# Patient Record
Sex: Female | Born: 1965 | Race: White | Hispanic: No | Smoking: Never smoker
Health system: Southern US, Community
[De-identification: ages and names within clinical notes are randomized; demographics above are authoritative.]

## PROBLEM LIST (undated history)

## (undated) DIAGNOSIS — I10 Essential (primary) hypertension: Secondary | ICD-10-CM

---

## 2011-03-20 ENCOUNTER — Other Ambulatory Visit: Payer: Self-pay | Admitting: Internal Medicine

## 2011-03-20 DIAGNOSIS — Z1231 Encounter for screening mammogram for malignant neoplasm of breast: Secondary | ICD-10-CM

## 2011-04-10 ENCOUNTER — Ambulatory Visit
Admission: RE | Admit: 2011-04-10 | Discharge: 2011-04-10 | Disposition: A | Discharge status: Home / Self Care | Payer: BC Managed Care – PPO | Source: Ambulatory Visit | Attending: Internal Medicine | Admitting: Internal Medicine

## 2011-04-10 DIAGNOSIS — Z1231 Encounter for screening mammogram for malignant neoplasm of breast: Secondary | ICD-10-CM

## 2013-12-05 ENCOUNTER — Encounter: Payer: Self-pay | Admitting: Obstetrics & Gynecology

## 2014-10-19 ENCOUNTER — Telehealth: Payer: Self-pay | Admitting: *Deleted

## 2014-10-19 NOTE — Telephone Encounter (Signed)
error 

## 2016-02-24 DIAGNOSIS — Z1151 Encounter for screening for human papillomavirus (HPV): Secondary | ICD-10-CM | POA: Diagnosis not present

## 2016-02-24 DIAGNOSIS — N920 Excessive and frequent menstruation with regular cycle: Secondary | ICD-10-CM | POA: Diagnosis not present

## 2016-02-24 DIAGNOSIS — R87611 Atypical squamous cells cannot exclude high grade squamous intraepithelial lesion on cytologic smear of cervix (ASC-H): Secondary | ICD-10-CM | POA: Diagnosis not present

## 2016-02-24 DIAGNOSIS — Z01419 Encounter for gynecological examination (general) (routine) without abnormal findings: Secondary | ICD-10-CM | POA: Diagnosis not present

## 2016-03-16 DIAGNOSIS — D259 Leiomyoma of uterus, unspecified: Secondary | ICD-10-CM | POA: Diagnosis not present

## 2016-03-16 DIAGNOSIS — N920 Excessive and frequent menstruation with regular cycle: Secondary | ICD-10-CM | POA: Diagnosis not present

## 2016-03-27 DIAGNOSIS — Z87891 Personal history of nicotine dependence: Secondary | ICD-10-CM | POA: Diagnosis not present

## 2016-03-27 DIAGNOSIS — R079 Chest pain, unspecified: Secondary | ICD-10-CM | POA: Diagnosis not present

## 2016-03-29 DIAGNOSIS — N920 Excessive and frequent menstruation with regular cycle: Secondary | ICD-10-CM | POA: Diagnosis not present

## 2016-05-09 DIAGNOSIS — R87610 Atypical squamous cells of undetermined significance on cytologic smear of cervix (ASC-US): Secondary | ICD-10-CM | POA: Diagnosis not present

## 2016-05-09 DIAGNOSIS — N920 Excessive and frequent menstruation with regular cycle: Secondary | ICD-10-CM | POA: Diagnosis not present

## 2016-05-09 DIAGNOSIS — Z30430 Encounter for insertion of intrauterine contraceptive device: Secondary | ICD-10-CM | POA: Diagnosis not present

## 2016-06-22 DIAGNOSIS — Z30431 Encounter for routine checking of intrauterine contraceptive device: Secondary | ICD-10-CM | POA: Diagnosis not present

## 2016-09-02 DIAGNOSIS — H66002 Acute suppurative otitis media without spontaneous rupture of ear drum, left ear: Secondary | ICD-10-CM | POA: Diagnosis not present

## 2017-01-31 DIAGNOSIS — I10 Essential (primary) hypertension: Secondary | ICD-10-CM | POA: Diagnosis not present

## 2017-01-31 DIAGNOSIS — D509 Iron deficiency anemia, unspecified: Secondary | ICD-10-CM | POA: Diagnosis not present

## 2017-01-31 DIAGNOSIS — E119 Type 2 diabetes mellitus without complications: Secondary | ICD-10-CM | POA: Diagnosis not present

## 2017-01-31 DIAGNOSIS — E039 Hypothyroidism, unspecified: Secondary | ICD-10-CM | POA: Diagnosis not present

## 2017-02-02 DIAGNOSIS — E039 Hypothyroidism, unspecified: Secondary | ICD-10-CM | POA: Diagnosis not present

## 2017-02-02 DIAGNOSIS — E119 Type 2 diabetes mellitus without complications: Secondary | ICD-10-CM | POA: Diagnosis not present

## 2017-02-02 DIAGNOSIS — I10 Essential (primary) hypertension: Secondary | ICD-10-CM | POA: Diagnosis not present

## 2017-02-02 DIAGNOSIS — E782 Mixed hyperlipidemia: Secondary | ICD-10-CM | POA: Diagnosis not present

## 2018-02-18 DIAGNOSIS — E039 Hypothyroidism, unspecified: Secondary | ICD-10-CM | POA: Diagnosis not present

## 2018-02-18 DIAGNOSIS — E669 Obesity, unspecified: Secondary | ICD-10-CM | POA: Diagnosis not present

## 2018-02-18 DIAGNOSIS — R7301 Impaired fasting glucose: Secondary | ICD-10-CM | POA: Diagnosis not present

## 2018-02-18 DIAGNOSIS — E782 Mixed hyperlipidemia: Secondary | ICD-10-CM | POA: Diagnosis not present

## 2018-02-21 DIAGNOSIS — E782 Mixed hyperlipidemia: Secondary | ICD-10-CM | POA: Diagnosis not present

## 2018-02-21 DIAGNOSIS — E039 Hypothyroidism, unspecified: Secondary | ICD-10-CM | POA: Diagnosis not present

## 2018-02-21 DIAGNOSIS — Z6841 Body Mass Index (BMI) 40.0 and over, adult: Secondary | ICD-10-CM | POA: Diagnosis not present

## 2018-02-21 DIAGNOSIS — Z0000 Encounter for general adult medical examination without abnormal findings: Secondary | ICD-10-CM | POA: Diagnosis not present

## 2018-02-21 DIAGNOSIS — N920 Excessive and frequent menstruation with regular cycle: Secondary | ICD-10-CM | POA: Diagnosis not present

## 2018-03-18 DIAGNOSIS — M62830 Muscle spasm of back: Secondary | ICD-10-CM | POA: Diagnosis not present

## 2018-03-18 DIAGNOSIS — E119 Type 2 diabetes mellitus without complications: Secondary | ICD-10-CM | POA: Diagnosis not present

## 2018-03-18 DIAGNOSIS — D509 Iron deficiency anemia, unspecified: Secondary | ICD-10-CM | POA: Diagnosis not present

## 2018-03-18 DIAGNOSIS — E039 Hypothyroidism, unspecified: Secondary | ICD-10-CM | POA: Diagnosis not present

## 2018-03-18 DIAGNOSIS — I10 Essential (primary) hypertension: Secondary | ICD-10-CM | POA: Diagnosis not present

## 2018-03-18 DIAGNOSIS — N920 Excessive and frequent menstruation with regular cycle: Secondary | ICD-10-CM | POA: Diagnosis not present

## 2018-03-18 DIAGNOSIS — G2581 Restless legs syndrome: Secondary | ICD-10-CM | POA: Diagnosis not present

## 2018-03-18 DIAGNOSIS — E782 Mixed hyperlipidemia: Secondary | ICD-10-CM | POA: Diagnosis not present

## 2018-03-18 DIAGNOSIS — Z0000 Encounter for general adult medical examination without abnormal findings: Secondary | ICD-10-CM | POA: Diagnosis not present

## 2018-03-18 DIAGNOSIS — E6609 Other obesity due to excess calories: Secondary | ICD-10-CM | POA: Diagnosis not present

## 2018-03-21 DIAGNOSIS — E039 Hypothyroidism, unspecified: Secondary | ICD-10-CM | POA: Diagnosis not present

## 2018-07-05 DIAGNOSIS — R69 Illness, unspecified: Secondary | ICD-10-CM | POA: Diagnosis not present

## 2018-07-05 DIAGNOSIS — G4700 Insomnia, unspecified: Secondary | ICD-10-CM | POA: Diagnosis not present

## 2018-07-31 DIAGNOSIS — J029 Acute pharyngitis, unspecified: Secondary | ICD-10-CM | POA: Diagnosis not present

## 2018-07-31 DIAGNOSIS — R69 Illness, unspecified: Secondary | ICD-10-CM | POA: Diagnosis not present

## 2018-09-12 DIAGNOSIS — Z0001 Encounter for general adult medical examination with abnormal findings: Secondary | ICD-10-CM | POA: Diagnosis not present

## 2018-09-19 DIAGNOSIS — E785 Hyperlipidemia, unspecified: Secondary | ICD-10-CM | POA: Diagnosis not present

## 2018-09-19 DIAGNOSIS — E039 Hypothyroidism, unspecified: Secondary | ICD-10-CM | POA: Diagnosis not present

## 2018-09-19 DIAGNOSIS — I10 Essential (primary) hypertension: Secondary | ICD-10-CM | POA: Diagnosis not present

## 2018-09-19 DIAGNOSIS — Z6841 Body Mass Index (BMI) 40.0 and over, adult: Secondary | ICD-10-CM | POA: Diagnosis not present

## 2018-09-19 DIAGNOSIS — Z23 Encounter for immunization: Secondary | ICD-10-CM | POA: Diagnosis not present

## 2019-09-03 ENCOUNTER — Encounter (HOSPITAL_COMMUNITY): Payer: Self-pay

## 2019-09-03 ENCOUNTER — Emergency Department (HOSPITAL_COMMUNITY)
Admission: EM | Admit: 2019-09-03 | Discharge: 2019-09-03 | Disposition: A | Discharge status: Home / Self Care | Payer: 59 | Attending: Emergency Medicine | Admitting: Emergency Medicine

## 2019-09-03 ENCOUNTER — Other Ambulatory Visit: Payer: Self-pay

## 2019-09-03 ENCOUNTER — Emergency Department (HOSPITAL_COMMUNITY): Payer: 59

## 2019-09-03 DIAGNOSIS — S51831A Puncture wound without foreign body of right forearm, initial encounter: Secondary | ICD-10-CM | POA: Diagnosis not present

## 2019-09-03 DIAGNOSIS — Y999 Unspecified external cause status: Secondary | ICD-10-CM | POA: Insufficient documentation

## 2019-09-03 DIAGNOSIS — Y929 Unspecified place or not applicable: Secondary | ICD-10-CM | POA: Diagnosis not present

## 2019-09-03 DIAGNOSIS — Y939 Activity, unspecified: Secondary | ICD-10-CM | POA: Insufficient documentation

## 2019-09-03 DIAGNOSIS — I10 Essential (primary) hypertension: Secondary | ICD-10-CM | POA: Insufficient documentation

## 2019-09-03 DIAGNOSIS — W540XXA Bitten by dog, initial encounter: Secondary | ICD-10-CM | POA: Insufficient documentation

## 2019-09-03 DIAGNOSIS — Z23 Encounter for immunization: Secondary | ICD-10-CM | POA: Insufficient documentation

## 2019-09-03 HISTORY — DX: Essential (primary) hypertension: I10

## 2019-09-03 MED ORDER — AMOXICILLIN-POT CLAVULANATE 875-125 MG PO TABS
1.0000 | ORAL_TABLET | Freq: Once | ORAL | Status: AC
Start: 2019-09-03 — End: 2019-09-03
  Administered 2019-09-03: 08:00:00 1 via ORAL
  Filled 2019-09-03: qty 1

## 2019-09-03 MED ORDER — AMOXICILLIN-POT CLAVULANATE 875-125 MG PO TABS: 1.0000 | ORAL_TABLET | Freq: Two times a day (BID) | ORAL | 0 refills | Status: AC

## 2019-09-03 MED ORDER — BACITRACIN ZINC 500 UNIT/GM EX OINT
1.0000 "application " | TOPICAL_OINTMENT | Freq: Two times a day (BID) | CUTANEOUS | Status: DC
  Administered 2019-09-03: 1 via TOPICAL
  Filled 2019-09-03 (×2): qty 0.9

## 2019-09-03 MED ORDER — TETANUS-DIPHTH-ACELL PERTUSSIS 5-2.5-18.5 LF-MCG/0.5 IM SUSP
0.5000 mL | Freq: Once | INTRAMUSCULAR | Status: AC
  Administered 2019-09-03: 09:00:00 0.5 mL via INTRAMUSCULAR
  Filled 2019-09-03: qty 0.5

## 2019-09-03 MED ORDER — BACITRACIN ZINC 500 UNIT/GM EX OINT: 1.0000 "application " | TOPICAL_OINTMENT | Freq: Two times a day (BID) | CUTANEOUS | 0 refills | Status: AC

## 2019-09-03 NOTE — ED Triage Notes (Signed)
Pt has an elderly hound dog that bit her today. States dog is not up to date on vaccines, but is an inside dog. Pt has 4 puncture wounds to right forearm.

## 2019-09-03 NOTE — Discharge Instructions (Addendum)
1. Medications: Augmentin, Bacitracin, usual home medications 2. Treatment: rest, drink plenty of fluids, keep wounds clean with warm soap and water twice per day.  Apply bacitracin after washing the wounds. 3. Follow Up: Please followup with your primary doctor in 3 days wound check; Please return to the ER for signs or symptoms of infection, worsening pain or other concerns.

## 2019-09-03 NOTE — ED Provider Notes (Addendum)
[facility] EMERGENCY DEPARTMENT Provider Note   CSN: [id number] Arrival date & time: 09/03/19  4174     History Chief Complaint  Patient presents with  . Animal Bite    Jaime Hood is a 54 y.o. female with a hx of HTN, depression presents to the Emergency Department complaining of acute, persistent puncture wounds to the right forearm onset around 6 AM when she was bitten by her dog.  Patient reports the dog is inside only and only goes outside on a leash.  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  She reports she reached down for his collar when he bit her.  She denies numbness, tingling or weakness.  She denies a history of immunocompromise or diabetes.  Patient reports she washed the wounds and then came to the emergency room for evaluation.  Additionally she took Advil for pain control which has helped.  No specific aggravating factors.     The history is provided by the patient and medical records. No language interpreter was used.       Past Medical History:  Diagnosis Date  . Hypertension     There are no problems to display for this patient.   History reviewed. No pertinent surgical history.   OB History   No obstetric history on file.     No family history on file.  Social History   Tobacco Use  . Smoking status: Never Smoker  . Smokeless tobacco: Never Used  Substance Use Topics  . Alcohol use: Not on file  . Drug use: Not on file    Home Medications Prior to Admission medications   Medication Sig Start Date End Date Taking? Authorizing Provider  amoxicillin-clavulanate (AUGMENTIN) 875-125 MG tablet Take 1 tablet by mouth 2 (two) times daily for 10 days. One po bid x 7 days 09/03/19 09/13/19  Ronal Maybury, Jarrett Soho, PA-C  bacitracin ointment Apply 1 application topically 2 (two) times daily. 09/03/19   Deondra Wigger, Jarrett Soho, PA-C    Allergies    Patient has no allergy information on record.  Review of Systems   Review of Systems    Constitutional: Negative for fever.  Gastrointestinal: Negative for nausea and vomiting.  Skin: Positive for wound.  Allergic/Immunologic: Negative for immunocompromised state.  Neurological: Negative for weakness and numbness.  Hematological: Does not bruise/bleed easily.  Psychiatric/Behavioral: The patient is not nervous/anxious.     Physical Exam Updated Vital Signs BP (!) 160/112   Pulse (!) 114   Resp 12   Ht 5' 1.5" (1.562 m)   Wt 106.1 kg   SpO2 100%   BMI 43.50 kg/m   Physical Exam Vitals and nursing note reviewed.  Constitutional:      General: She is not in acute distress.    Appearance: She is well-developed.  HENT:     Head: Normocephalic.  Eyes:     General: No scleral icterus.    Conjunctiva/sclera: Conjunctivae normal.  Cardiovascular:     Rate and Rhythm: Normal rate.     Comments: Capillary refill less than 3 seconds in all fingers of the right hand Pulmonary:     Effort: Pulmonary effort is normal.  Musculoskeletal:        General: Normal range of motion.     Cervical back: Normal range of motion.     Comments: Full range of motion of the right elbow wrist and all fingers of the right hand  Skin:    General: Skin is warm and dry.  Findings: Bruising, ecchymosis, signs of injury and laceration present.     Comments: 4 puncture wounds noted to the right forearm, persistent oozing from the largest.  Bruising and ecchymosis surrounding the bite sites.  Neurological:     Mental Status: She is alert.     Comments: Sensation intact to the right upper extremity. Strength 5/5 in the right upper extremity     ED Results / Procedures / Treatments    Radiology DG Forearm Right  Result Date: 09/03/2019 CLINICAL DATA:  Dog bite EXAM: RIGHT FOREARM - 2 VIEW COMPARISON:  None. FINDINGS: There is soft tissue swelling and foci of air along the radial and dorsal aspect of the forearm. Alignment is anatomic. There is no acute fracture. Joint spaces are  preserved. No radiopaque foreign body. IMPRESSION: No acute osseous abnormality.  No radiopaque foreign body. Electronically Signed   By: Guadlupe Spanish M.D.   On: 09/03/2019 08:17    Procedures Irrigation  Date/Time: 09/03/2019 8:26 AM Performed by: Dierdre Forth, PA-C Authorized by: Dierdre Forth, PA-C  Consent: Verbal consent obtained. Risks and benefits: risks, benefits and alternatives were discussed Consent given by: patient Patient understanding: patient states understanding of the procedure being performed Patient consent: the patient's understanding of the procedure matches consent given Procedure consent: procedure consent matches procedure scheduled Relevant documents: relevant documents present and verified Test results: test results available and properly labeled Site marked: the operative site was marked Imaging studies: imaging studies available Required items: required blood products, implants, devices, and special equipment available Patient identity confirmed: verbally with patient Time out: Immediately prior to procedure a "time out" was called to verify the correct patient, procedure, equipment, support staff and site/side marked as required. Preparation: Patient was prepped and draped in the usual sterile fashion. Local anesthesia used: no  Anesthesia: Local anesthesia used: no  Sedation: Patient sedated: no  Patient tolerance: patient tolerated the procedure well with no immediate complications Comments: Individual bites washed with iodine scrub, pressure syringe and saline.  Wound explored without foreign body. Bacitracin and bandage applied.     (including critical care time)  Medications Ordered in ED Medications  bacitracin ointment 1 application (1 application Topical Given 09/03/19 0835)  amoxicillin-clavulanate (AUGMENTIN) 875-125 MG per tablet 1 tablet (1 tablet Oral Given 09/03/19 0756)  Tdap (BOOSTRIX) injection 0.5 mL (0.5 mLs  Intramuscular Given 09/03/19 2130)    ED Course  I have reviewed the triage vital signs and the nursing notes.  Pertinent labs & imaging results that were available during my care of the patient were reviewed by me and considered in my medical decision making (see chart for details).  Clinical Course as of Sep 02 842  Wed Sep 03, 2019  0800 Tachycardia at triage.  No tachycardia on my clinical exam.  Pulse Rate(!): 114 [HM]  0843 Heart rate and blood pressure improved  Pulse Rate: 85 [HM]    Clinical Course User Index [HM] Thorne Wirz, Boyd Kerbs   MDM Rules/Calculators/A&P                      Patient presents with several puncture wounds from a dog bite.  Pt wounds irrigated well with pressure syringe with sterile saline.  Wounds examined with visualization of the base and no foreign bodies seen.  Pt Alert and oriented, NAD, nontoxic, nonseptic appearing.  Capillary refill intact and pt without neurologic deficit.  Right forearm x-rays without fracture or foreign body. Patient tetanus updated.  Patient rabies  vaccine and immunoglobulin risk and benefit discussed.  Patient has no concern about potential rabies exposure given that her dog lives inside with her.  She does not wish for rabies treatment today.  I think this is reasonable. Wounds not closed secondary to concern for infection. Will d/c home with Augmentin, bacitracin and wound care instructions.  Requests for close follow-up with PCP or back in the ER for symptoms of infection.     Final Clinical Impression(s) / ED Diagnoses Final diagnoses:  Dog bite, initial encounter    Rx / DC Orders ED Discharge Orders         Ordered    bacitracin ointment  2 times daily     09/03/19 0834    amoxicillin-clavulanate (AUGMENTIN) 875-125 MG tablet  2 times daily     09/03/19 0834             Irven Ingalsbe, Dahlia Client, PA-C 09/03/19 4403    Bethann Berkshire, MD 09/05/19 1039

## 2020-01-22 DIAGNOSIS — E039 Hypothyroidism, unspecified: Secondary | ICD-10-CM | POA: Diagnosis not present

## 2020-01-22 DIAGNOSIS — G2581 Restless legs syndrome: Secondary | ICD-10-CM | POA: Diagnosis not present

## 2020-01-22 DIAGNOSIS — R69 Illness, unspecified: Secondary | ICD-10-CM | POA: Diagnosis not present

## 2020-01-22 DIAGNOSIS — Z79899 Other long term (current) drug therapy: Secondary | ICD-10-CM | POA: Diagnosis not present

## 2020-01-22 DIAGNOSIS — I10 Essential (primary) hypertension: Secondary | ICD-10-CM | POA: Diagnosis not present

## 2020-01-27 DIAGNOSIS — E785 Hyperlipidemia, unspecified: Secondary | ICD-10-CM | POA: Diagnosis not present

## 2020-01-27 DIAGNOSIS — E039 Hypothyroidism, unspecified: Secondary | ICD-10-CM | POA: Diagnosis not present

## 2020-01-27 DIAGNOSIS — Z6841 Body Mass Index (BMI) 40.0 and over, adult: Secondary | ICD-10-CM | POA: Diagnosis not present

## 2020-01-27 DIAGNOSIS — I10 Essential (primary) hypertension: Secondary | ICD-10-CM | POA: Diagnosis not present

## 2020-06-01 DIAGNOSIS — Z23 Encounter for immunization: Secondary | ICD-10-CM | POA: Diagnosis not present

## 2020-06-01 DIAGNOSIS — I10 Essential (primary) hypertension: Secondary | ICD-10-CM | POA: Diagnosis not present

## 2020-06-05 IMAGING — DX DG FOREARM 2V*R*
2 series · 2 of 2 positions shown · non-contrast
Comparison: None.

CLINICAL DATA: Dog bite

EXAM:
RIGHT FOREARM - 2 VIEW

[forearm ap]
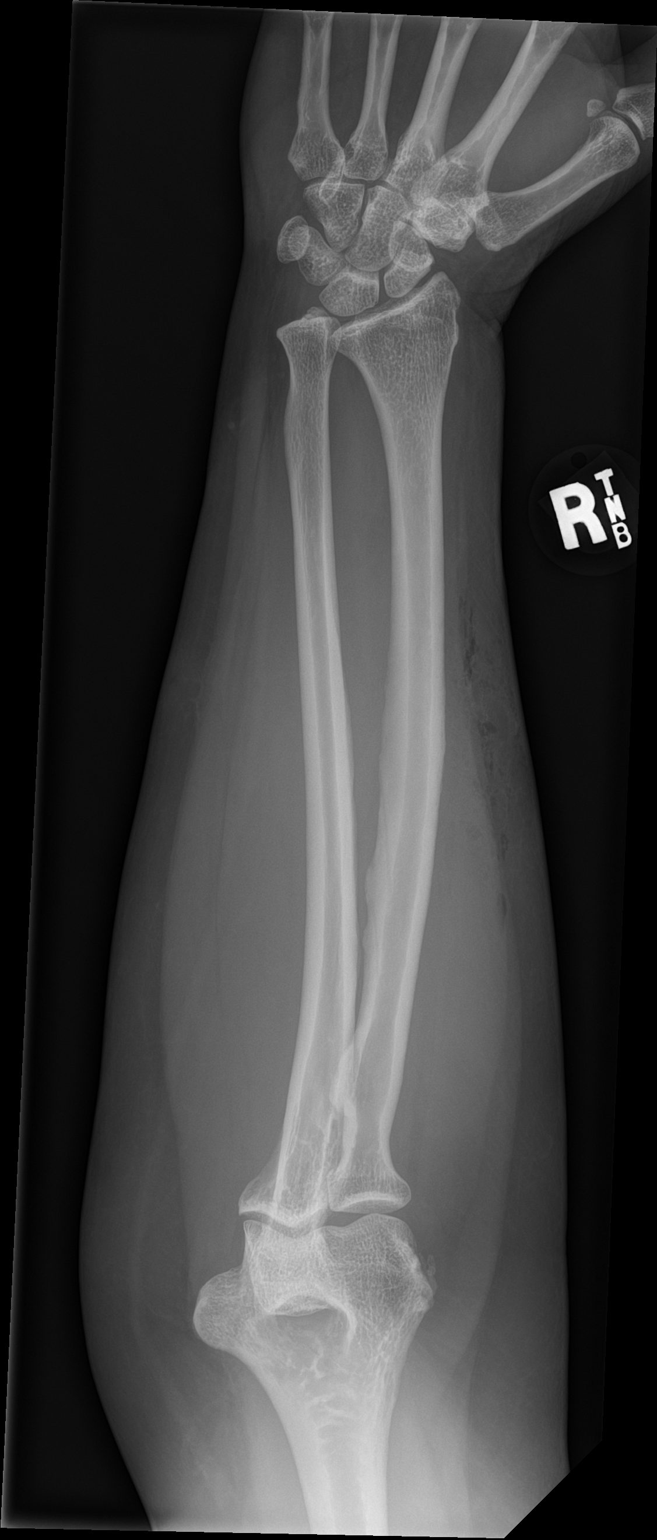

[forearm lat]
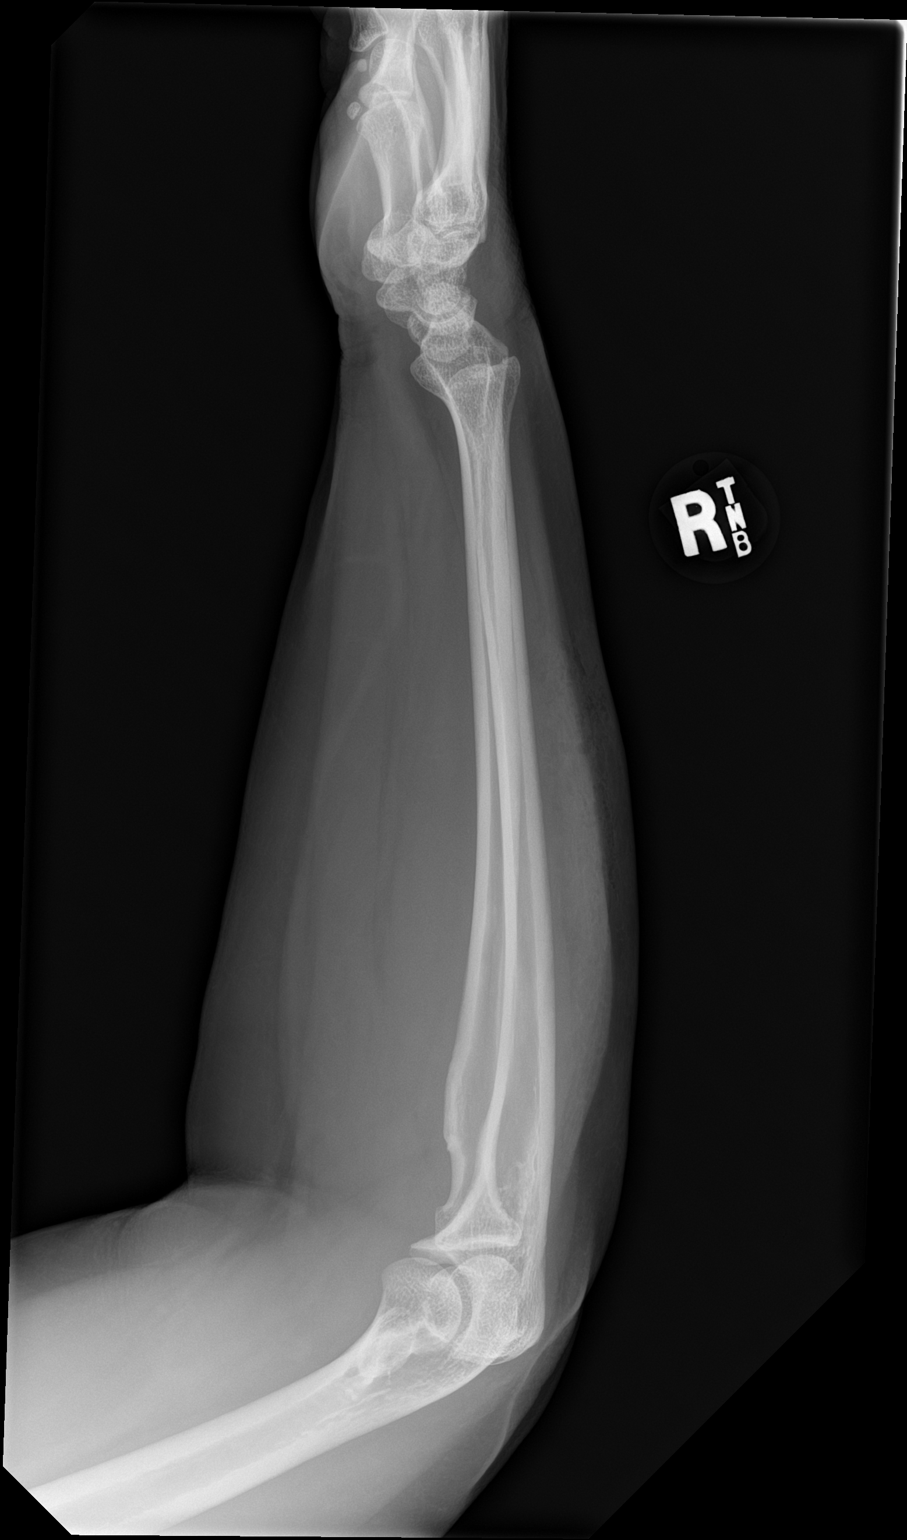

[2 of 2 positions shown; findings below may reference images not displayed]

FINDINGS: There is soft tissue swelling and foci of air along the radial and
dorsal aspect of the forearm. Alignment is anatomic. There is no
acute fracture. Joint spaces are preserved. No radiopaque foreign
body.
IMPRESSION: No acute osseous abnormality.  No radiopaque foreign body.

## 2020-09-14 DIAGNOSIS — I10 Essential (primary) hypertension: Secondary | ICD-10-CM | POA: Diagnosis not present

## 2020-09-14 DIAGNOSIS — R69 Illness, unspecified: Secondary | ICD-10-CM | POA: Diagnosis not present

## 2022-05-19 DIAGNOSIS — S61431A Puncture wound without foreign body of right hand, initial encounter: Secondary | ICD-10-CM | POA: Diagnosis not present

## 2022-09-04 DIAGNOSIS — E039 Hypothyroidism, unspecified: Secondary | ICD-10-CM | POA: Diagnosis not present

## 2022-09-04 DIAGNOSIS — I10 Essential (primary) hypertension: Secondary | ICD-10-CM | POA: Diagnosis not present

## 2022-09-04 DIAGNOSIS — Z79899 Other long term (current) drug therapy: Secondary | ICD-10-CM | POA: Diagnosis not present

## 2022-09-04 DIAGNOSIS — E785 Hyperlipidemia, unspecified: Secondary | ICD-10-CM | POA: Diagnosis not present

## 2022-09-04 DIAGNOSIS — F4381 Prolonged grief disorder: Secondary | ICD-10-CM | POA: Diagnosis not present

## 2022-10-31 DIAGNOSIS — I10 Essential (primary) hypertension: Secondary | ICD-10-CM | POA: Diagnosis not present

## 2022-10-31 DIAGNOSIS — F325 Major depressive disorder, single episode, in full remission: Secondary | ICD-10-CM | POA: Diagnosis not present

## 2022-10-31 DIAGNOSIS — E039 Hypothyroidism, unspecified: Secondary | ICD-10-CM | POA: Diagnosis not present

## 2022-12-21 DIAGNOSIS — N132 Hydronephrosis with renal and ureteral calculous obstruction: Secondary | ICD-10-CM | POA: Diagnosis not present

## 2022-12-21 DIAGNOSIS — N200 Calculus of kidney: Secondary | ICD-10-CM | POA: Diagnosis not present

## 2022-12-21 DIAGNOSIS — N1330 Unspecified hydronephrosis: Secondary | ICD-10-CM | POA: Diagnosis not present

## 2022-12-21 DIAGNOSIS — R109 Unspecified abdominal pain: Secondary | ICD-10-CM | POA: Diagnosis not present

## 2022-12-21 DIAGNOSIS — N134 Hydroureter: Secondary | ICD-10-CM | POA: Diagnosis not present

## 2022-12-21 DIAGNOSIS — R112 Nausea with vomiting, unspecified: Secondary | ICD-10-CM | POA: Diagnosis not present

## 2023-06-23 DIAGNOSIS — Z6841 Body Mass Index (BMI) 40.0 and over, adult: Secondary | ICD-10-CM | POA: Diagnosis not present

## 2023-06-23 DIAGNOSIS — H6091 Unspecified otitis externa, right ear: Secondary | ICD-10-CM | POA: Diagnosis not present

## 2023-06-23 DIAGNOSIS — H6501 Acute serous otitis media, right ear: Secondary | ICD-10-CM | POA: Diagnosis not present

## 2023-06-28 DIAGNOSIS — Z6841 Body Mass Index (BMI) 40.0 and over, adult: Secondary | ICD-10-CM | POA: Diagnosis not present

## 2023-06-28 DIAGNOSIS — R030 Elevated blood-pressure reading, without diagnosis of hypertension: Secondary | ICD-10-CM | POA: Diagnosis not present

## 2023-06-28 DIAGNOSIS — W540XXA Bitten by dog, initial encounter: Secondary | ICD-10-CM | POA: Diagnosis not present

## 2023-06-28 DIAGNOSIS — S81851A Open bite, right lower leg, initial encounter: Secondary | ICD-10-CM | POA: Diagnosis not present

## 2023-07-08 DIAGNOSIS — S81851D Open bite, right lower leg, subsequent encounter: Secondary | ICD-10-CM | POA: Diagnosis not present

## 2023-07-08 DIAGNOSIS — R030 Elevated blood-pressure reading, without diagnosis of hypertension: Secondary | ICD-10-CM | POA: Diagnosis not present

## 2023-07-08 DIAGNOSIS — Z6841 Body Mass Index (BMI) 40.0 and over, adult: Secondary | ICD-10-CM | POA: Diagnosis not present

## 2023-10-16 DIAGNOSIS — F329 Major depressive disorder, single episode, unspecified: Secondary | ICD-10-CM | POA: Diagnosis not present

## 2023-10-16 DIAGNOSIS — E785 Hyperlipidemia, unspecified: Secondary | ICD-10-CM | POA: Diagnosis not present

## 2023-10-16 DIAGNOSIS — I10 Essential (primary) hypertension: Secondary | ICD-10-CM | POA: Diagnosis not present

## 2023-10-16 DIAGNOSIS — E039 Hypothyroidism, unspecified: Secondary | ICD-10-CM | POA: Diagnosis not present

## 2023-10-16 DIAGNOSIS — Z79899 Other long term (current) drug therapy: Secondary | ICD-10-CM | POA: Diagnosis not present

## 2023-10-23 DIAGNOSIS — I10 Essential (primary) hypertension: Secondary | ICD-10-CM | POA: Diagnosis not present

## 2023-10-29 DIAGNOSIS — H6693 Otitis media, unspecified, bilateral: Secondary | ICD-10-CM | POA: Diagnosis not present

## 2023-10-29 DIAGNOSIS — H6092 Unspecified otitis externa, left ear: Secondary | ICD-10-CM | POA: Diagnosis not present

## 2024-01-16 DIAGNOSIS — Z30432 Encounter for removal of intrauterine contraceptive device: Secondary | ICD-10-CM | POA: Diagnosis not present

## 2024-01-23 DIAGNOSIS — E039 Hypothyroidism, unspecified: Secondary | ICD-10-CM | POA: Diagnosis not present

## 2024-01-28 DIAGNOSIS — E039 Hypothyroidism, unspecified: Secondary | ICD-10-CM | POA: Diagnosis not present

## 2024-01-28 DIAGNOSIS — I10 Essential (primary) hypertension: Secondary | ICD-10-CM | POA: Diagnosis not present
